# Patient Record
Sex: Male | Born: 1955 | Race: White | Hispanic: No | Marital: Single | Smoking: Former smoker
Health system: Southern US, Community
[De-identification: ages and names within clinical notes are randomized; demographics above are authoritative.]

## PROBLEM LIST (undated history)

## (undated) DIAGNOSIS — I10 Essential (primary) hypertension: Secondary | ICD-10-CM

## (undated) DIAGNOSIS — M1990 Unspecified osteoarthritis, unspecified site: Secondary | ICD-10-CM

## (undated) DIAGNOSIS — J449 Chronic obstructive pulmonary disease, unspecified: Secondary | ICD-10-CM

## (undated) HISTORY — PX: TONSILLECTOMY: SUR1361

## (undated) HISTORY — PX: KNEE SURGERY: SHX244

---

## 2003-07-02 ENCOUNTER — Emergency Department (HOSPITAL_COMMUNITY): Admission: EM | Admit: 2003-07-02 | Discharge: 2003-07-02 | Payer: Self-pay | Admitting: Emergency Medicine

## 2005-10-09 ENCOUNTER — Emergency Department (HOSPITAL_COMMUNITY): Admission: EM | Admit: 2005-10-09 | Discharge: 2005-10-10 | Payer: Self-pay | Admitting: Emergency Medicine

## 2010-03-12 ENCOUNTER — Encounter: Payer: Self-pay | Admitting: Internal Medicine

## 2010-03-12 DIAGNOSIS — Z8639 Personal history of other endocrine, nutritional and metabolic disease: Secondary | ICD-10-CM

## 2010-03-12 DIAGNOSIS — M545 Low back pain, unspecified: Secondary | ICD-10-CM | POA: Insufficient documentation

## 2010-03-12 DIAGNOSIS — K429 Umbilical hernia without obstruction or gangrene: Secondary | ICD-10-CM | POA: Insufficient documentation

## 2010-03-12 DIAGNOSIS — E7800 Pure hypercholesterolemia, unspecified: Secondary | ICD-10-CM | POA: Insufficient documentation

## 2010-03-12 DIAGNOSIS — R062 Wheezing: Secondary | ICD-10-CM | POA: Insufficient documentation

## 2010-03-12 DIAGNOSIS — I10 Essential (primary) hypertension: Secondary | ICD-10-CM | POA: Insufficient documentation

## 2010-03-12 DIAGNOSIS — Z862 Personal history of diseases of the blood and blood-forming organs and certain disorders involving the immune mechanism: Secondary | ICD-10-CM | POA: Insufficient documentation

## 2010-03-12 DIAGNOSIS — M25569 Pain in unspecified knee: Secondary | ICD-10-CM | POA: Insufficient documentation

## 2010-03-12 DIAGNOSIS — J309 Allergic rhinitis, unspecified: Secondary | ICD-10-CM | POA: Insufficient documentation

## 2010-03-12 DIAGNOSIS — G4730 Sleep apnea, unspecified: Secondary | ICD-10-CM | POA: Insufficient documentation

## 2010-03-12 DIAGNOSIS — I8390 Asymptomatic varicose veins of unspecified lower extremity: Secondary | ICD-10-CM | POA: Insufficient documentation

## 2010-03-24 ENCOUNTER — Telehealth: Payer: Self-pay | Admitting: Internal Medicine

## 2010-09-30 NOTE — Progress Notes (Signed)
Summary: rsc appt  Phone Note Call from Patient   Caller: [email or web address]  Call For: Jerrie Gullo Summary of Call: In ref to 7/13 cancelled appt, pt states he will call to rsc. Initial call taken by: Darletta Moll,  March 24, 2010 2:21 PM

## 2011-10-04 ENCOUNTER — Encounter (HOSPITAL_BASED_OUTPATIENT_CLINIC_OR_DEPARTMENT_OTHER): Payer: Self-pay | Admitting: *Deleted

## 2011-10-04 ENCOUNTER — Emergency Department (INDEPENDENT_AMBULATORY_CARE_PROVIDER_SITE_OTHER): Payer: Managed Care, Other (non HMO)

## 2011-10-04 ENCOUNTER — Emergency Department (HOSPITAL_BASED_OUTPATIENT_CLINIC_OR_DEPARTMENT_OTHER)
Admission: EM | Admit: 2011-10-04 | Discharge: 2011-10-04 | Disposition: A | Discharge status: Home / Self Care | Payer: Managed Care, Other (non HMO) | Attending: Emergency Medicine | Admitting: Emergency Medicine

## 2011-10-04 DIAGNOSIS — S8990XA Unspecified injury of unspecified lower leg, initial encounter: Secondary | ICD-10-CM

## 2011-10-04 DIAGNOSIS — M7730 Calcaneal spur, unspecified foot: Secondary | ICD-10-CM

## 2011-10-04 DIAGNOSIS — Z79899 Other long term (current) drug therapy: Secondary | ICD-10-CM | POA: Insufficient documentation

## 2011-10-04 DIAGNOSIS — X58XXXA Exposure to other specified factors, initial encounter: Secondary | ICD-10-CM

## 2011-10-04 DIAGNOSIS — M79609 Pain in unspecified limb: Secondary | ICD-10-CM

## 2011-10-04 DIAGNOSIS — Y92009 Unspecified place in unspecified non-institutional (private) residence as the place of occurrence of the external cause: Secondary | ICD-10-CM | POA: Insufficient documentation

## 2011-10-04 DIAGNOSIS — S99929A Unspecified injury of unspecified foot, initial encounter: Secondary | ICD-10-CM

## 2011-10-04 DIAGNOSIS — J4489 Other specified chronic obstructive pulmonary disease: Secondary | ICD-10-CM | POA: Insufficient documentation

## 2011-10-04 DIAGNOSIS — J449 Chronic obstructive pulmonary disease, unspecified: Secondary | ICD-10-CM | POA: Insufficient documentation

## 2011-10-04 DIAGNOSIS — Z8739 Personal history of other diseases of the musculoskeletal system and connective tissue: Secondary | ICD-10-CM | POA: Insufficient documentation

## 2011-10-04 DIAGNOSIS — X500XXA Overexertion from strenuous movement or load, initial encounter: Secondary | ICD-10-CM | POA: Insufficient documentation

## 2011-10-04 DIAGNOSIS — S93609A Unspecified sprain of unspecified foot, initial encounter: Secondary | ICD-10-CM | POA: Insufficient documentation

## 2011-10-04 HISTORY — DX: Chronic obstructive pulmonary disease, unspecified: J44.9

## 2011-10-04 HISTORY — DX: Unspecified osteoarthritis, unspecified site: M19.90

## 2011-10-04 MED ORDER — HYDROCODONE-ACETAMINOPHEN 5-325 MG PO TABS
2.0000 | ORAL_TABLET | Freq: Once | ORAL | Status: AC
  Administered 2011-10-04: 2 via ORAL
  Filled 2011-10-04: qty 2

## 2011-10-04 MED ORDER — HYDROCODONE-ACETAMINOPHEN 5-500 MG PO TABS: 1.0000 | ORAL_TABLET | Freq: Four times a day (QID) | ORAL | Status: AC | PRN

## 2011-10-04 NOTE — ED Provider Notes (Signed)
History     CSN: [id number]  Arrival date & time 10/04/11  2048   First MD Initiated Contact with Patient 10/04/11 2049      Chief Complaint  Patient presents with  . Foot Injury    (Consider location/radiation/quality/duration/timing/severity/associated sxs/prior treatment) HPI Comments: Pt states that he fell thru a folding chair and twisted his foot  Patient is a 56 y.o. male presenting with foot injury. The history is provided by the patient. No language interpreter was used.  Foot Injury  The incident occurred 3 to 5 hours ago. The incident occurred at home. The injury mechanism was a fall. The pain is present in the left foot. The quality of the pain is described as aching. The pain is moderate. The pain has been constant since onset. The symptoms are aggravated by activity and bearing weight.    Past Medical History  Diagnosis Date  . Arthritis   . Asthma   . COPD (chronic obstructive pulmonary disease)     Past Surgical History  Procedure Date  . Tonsillectomy   . Knee surgery     No family history on file.  History  Substance Use Topics  . Smoking status: Current Everyday Smoker -- 0.5 packs/day    Types: Cigarettes  . Smokeless tobacco: Never Used  . Alcohol Use: 6.0 oz/week    10 Cans of beer per week      Review of Systems  All other systems reviewed and are negative.    Allergies  Atorvastatin  Home Medications   Current Outpatient Rx  Name Route Sig Dispense Refill  . ALPRAZOLAM 0.5 MG PO TABS Oral Take 0.5 mg by mouth 2 (two) times daily as needed. For anxiety    . CITALOPRAM HYDROBROMIDE 20 MG PO TABS Oral Take 20 mg by mouth daily.    Marland Kitchen HYDROCHLOROTHIAZIDE 25 MG PO TABS Oral Take 25 mg by mouth daily.    . OMEGA-3-ACID ETHYL ESTERS 1 G PO CAPS Oral Take 1 g by mouth daily.    . TRAMADOL HCL 50 MG PO TABS Oral Take 50 mg by mouth every 6 (six) hours as needed. For pain    . VERAPAMIL HCL ER 180 MG PO CP24 Oral Take 180 mg by mouth at  bedtime.      BP 153/67  Pulse 81  Temp(Src) 98.9 F (37.2 C) (Oral)  Resp 20  Ht 6\' 1"  (1.854 m)  Wt 341 lb (154.677 kg)  BMI 44.99 kg/m2  SpO2 99%  Physical Exam  Vitals reviewed. Constitutional: He is oriented to person, place, and time. He appears well-developed and well-nourished.  HENT:  Head: Atraumatic.  Eyes: EOM are normal.  Cardiovascular: Normal rate and regular rhythm.   Pulmonary/Chest: Effort normal.  Musculoskeletal:       Pt tender and mild swelling noted to the left lateral foot:pulses intact  Neurological: He is oriented to person, place, and time.  Skin: Skin is warm and dry.  Psychiatric: He has a normal mood and affect.    ED Course  Procedures (including critical care time)  Labs Reviewed - No data to display Dg Foot Complete Left  10/04/2011  *RADIOLOGY REPORT*  Clinical Data: Pain and tenderness after blunt trauma  LEFT FOOT - COMPLETE 3+ VIEW  Comparison: None.  Findings: Calcaneal spur at the plantar aponeurosis. Negative for fracture, dislocation, or other acute abnormality.  Normal alignment and mineralization. No other significant degenerative change.  Regional soft tissues unremarkable.  IMPRESSION:  1.  No acute abnormality. 2.  Calcaneal spur.  Original Report Authenticated By: Thora Lance III, M.D.     1. Foot sprain       MDM  No acute bony abnormality:pt is okay to follow up with his orthopedist as needed        Teressa Lower, NP 10/04/11 2153

## 2011-10-04 NOTE — ED Provider Notes (Signed)
Medical screening examination/treatment/procedure(s) were performed by non-physician practitioner and as supervising physician I was immediately available for consultation/collaboration.  Jestine Bicknell, MD 10/04/11 2201 

## 2011-10-04 NOTE — ED Notes (Signed)
Pt reports he was standing on a folding chair while working on his truck and "the bottom fell out"- c/o left foot pain- unable to ambulate

## 2012-10-04 IMAGING — CR DG FOOT COMPLETE 3+V*L*
3 series · 3 of 3 positions shown · non-contrast
Comparison: None.

CLINICAL DATA: Pain and tenderness after blunt trauma

LEFT FOOT - COMPLETE 3+ VIEW

[t foot ap left]
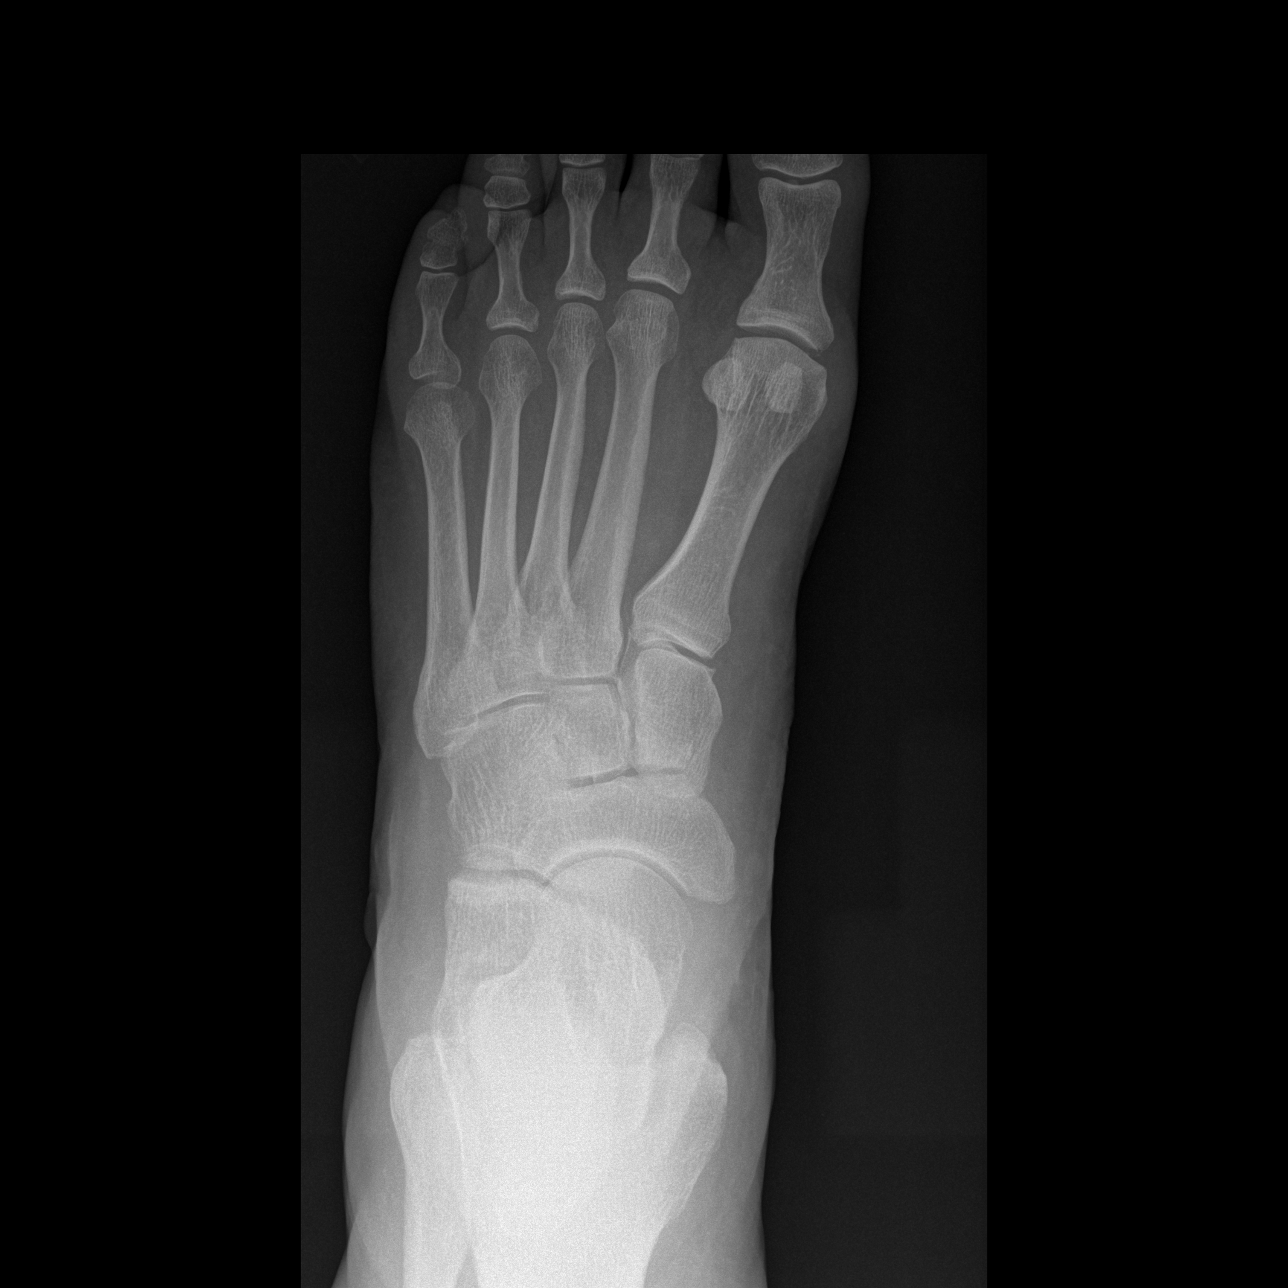

[t foot oblique left]
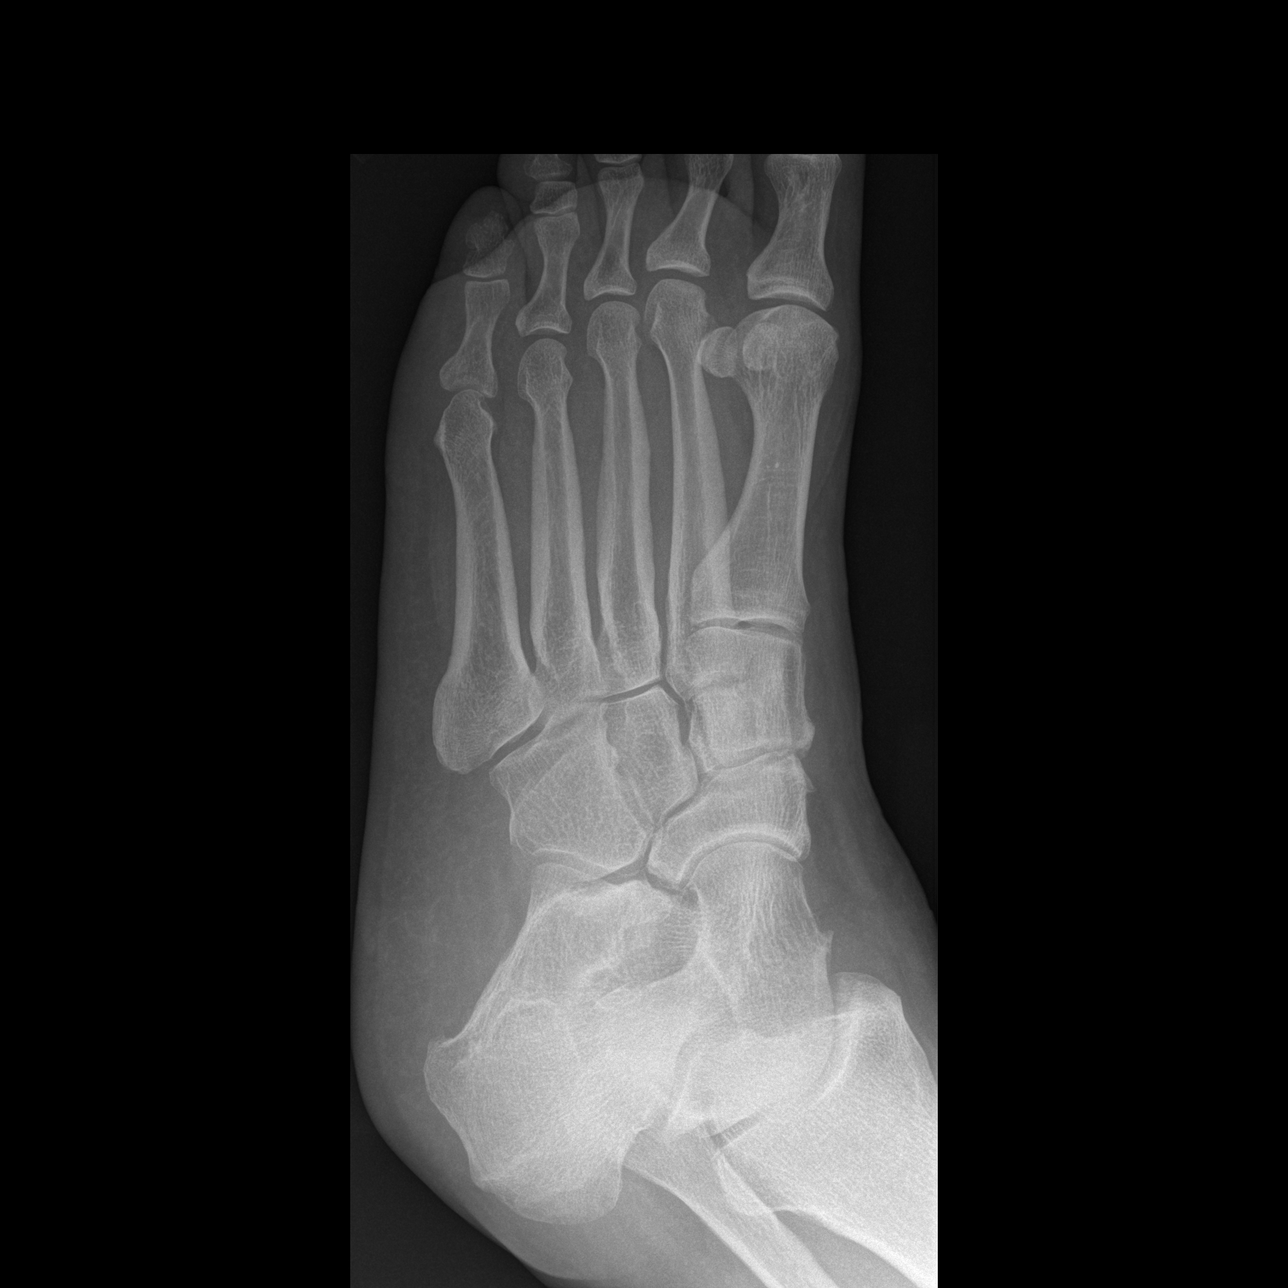

[t foot lat left]
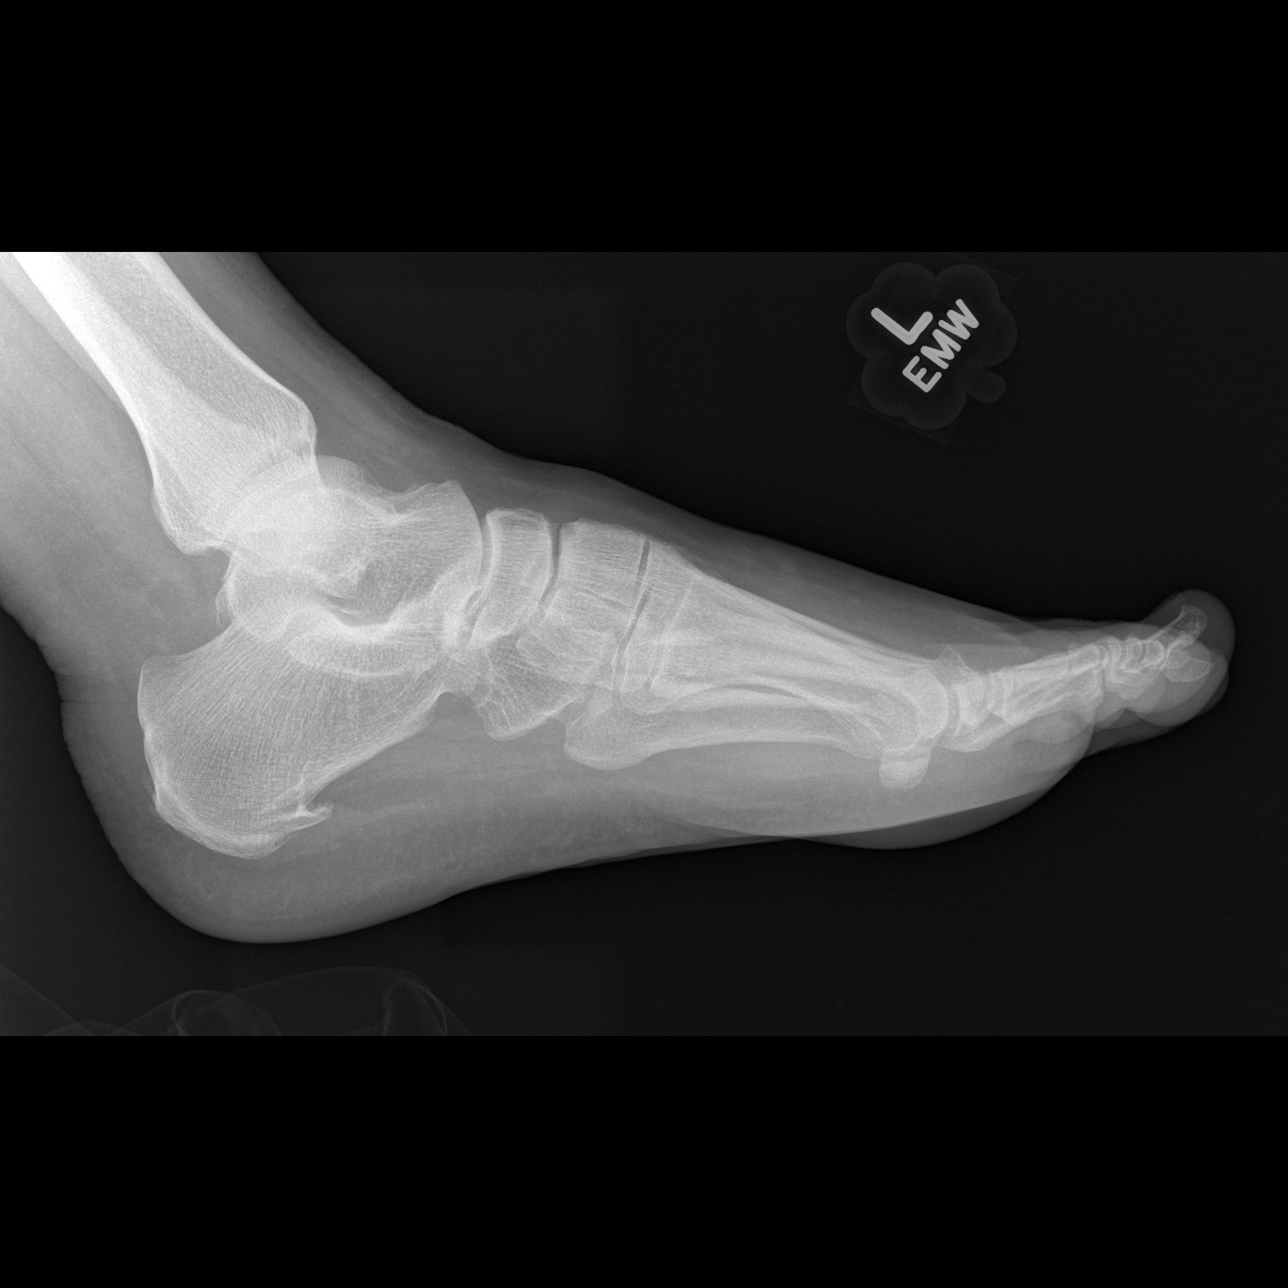

[3 of 3 positions shown; findings below may reference images not displayed]

FINDINGS: Calcaneal spur at the plantar aponeurosis. Negative for
fracture, dislocation, or other acute abnormality.  Normal
alignment and mineralization. No other significant degenerative
change.  Regional soft tissues unremarkable.
IMPRESSION: 1.  No acute abnormality.
2.  Calcaneal spur.

## 2016-11-25 ENCOUNTER — Emergency Department (HOSPITAL_COMMUNITY)
Admission: EM | Admit: 2016-11-25 | Discharge: 2016-11-25 | Disposition: A | Discharge status: Home / Self Care | Payer: BLUE CROSS/BLUE SHIELD | Attending: Emergency Medicine | Admitting: Emergency Medicine

## 2016-11-25 ENCOUNTER — Encounter (HOSPITAL_COMMUNITY): Payer: Self-pay | Admitting: Emergency Medicine

## 2016-11-25 DIAGNOSIS — Z79899 Other long term (current) drug therapy: Secondary | ICD-10-CM | POA: Insufficient documentation

## 2016-11-25 DIAGNOSIS — J449 Chronic obstructive pulmonary disease, unspecified: Secondary | ICD-10-CM | POA: Insufficient documentation

## 2016-11-25 DIAGNOSIS — I10 Essential (primary) hypertension: Secondary | ICD-10-CM | POA: Insufficient documentation

## 2016-11-25 DIAGNOSIS — F10230 Alcohol dependence with withdrawal, uncomplicated: Secondary | ICD-10-CM | POA: Diagnosis not present

## 2016-11-25 DIAGNOSIS — R251 Tremor, unspecified: Secondary | ICD-10-CM | POA: Diagnosis present

## 2016-11-25 DIAGNOSIS — Z87891 Personal history of nicotine dependence: Secondary | ICD-10-CM | POA: Insufficient documentation

## 2016-11-25 DIAGNOSIS — J45909 Unspecified asthma, uncomplicated: Secondary | ICD-10-CM | POA: Diagnosis not present

## 2016-11-25 HISTORY — DX: Essential (primary) hypertension: I10

## 2016-11-25 LAB — CBC WITH DIFFERENTIAL/PLATELET
Basophils Absolute: 0 10*3/uL (ref 0.0–0.1)
Basophils Relative: 1 %
Eosinophils Absolute: 0.2 10*3/uL (ref 0.0–0.7)
Eosinophils Relative: 3 %
HCT: 38.3 % — ABNORMAL LOW (ref 39.0–52.0)
Hemoglobin: 13.1 g/dL (ref 13.0–17.0)
Lymphocytes Relative: 19 %
Lymphs Abs: 1.2 10*3/uL (ref 0.7–4.0)
MCH: 33.1 pg (ref 26.0–34.0)
MCHC: 34.2 g/dL (ref 30.0–36.0)
MCV: 96.7 fL (ref 78.0–100.0)
Monocytes Absolute: 0.5 10*3/uL (ref 0.1–1.0)
Monocytes Relative: 9 %
Neutro Abs: 4.4 10*3/uL (ref 1.7–7.7)
Neutrophils Relative %: 68 %
Platelets: 147 10*3/uL — ABNORMAL LOW (ref 150–400)
RBC: 3.96 MIL/uL — ABNORMAL LOW (ref 4.22–5.81)
RDW: 13.9 % (ref 11.5–15.5)
WBC: 6.4 10*3/uL (ref 4.0–10.5)

## 2016-11-25 LAB — COMPREHENSIVE METABOLIC PANEL
ALT: 27 U/L (ref 17–63)
AST: 39 U/L (ref 15–41)
Albumin: 3.7 g/dL (ref 3.5–5.0)
Alkaline Phosphatase: 64 U/L (ref 38–126)
Anion gap: 8 (ref 5–15)
BUN: 10 mg/dL (ref 6–20)
CO2: 25 mmol/L (ref 22–32)
Calcium: 8.8 mg/dL — ABNORMAL LOW (ref 8.9–10.3)
Chloride: 106 mmol/L (ref 101–111)
Creatinine, Ser: 0.73 mg/dL (ref 0.61–1.24)
GFR calc Af Amer: >60 mL/min (ref >60)
GFR calc non Af Amer: >60 mL/min (ref >60)
Glucose, Bld: 96 mg/dL (ref 65–99)
Potassium: 3.8 mmol/L (ref 3.5–5.1)
Sodium: 139 mmol/L (ref 135–145)
Total Bilirubin: 0.7 mg/dL (ref 0.3–1.2)
Total Protein: 6.4 g/dL — ABNORMAL LOW (ref 6.5–8.1)

## 2016-11-25 LAB — ETHANOL: Alcohol, Ethyl (B): 15 mg/dL — ABNORMAL HIGH (ref <5)

## 2016-11-25 MED ORDER — CHLORDIAZEPOXIDE HCL 25 MG PO CAPS: ORAL_CAPSULE | ORAL | 0 refills | Status: AC

## 2016-11-25 MED ORDER — LORAZEPAM 1 MG PO TABS
1.0000 mg | ORAL_TABLET | Freq: Once | ORAL | Status: AC
  Administered 2016-11-25: 1 mg via ORAL
  Filled 2016-11-25: qty 1

## 2016-11-25 MED ORDER — VITAMIN B-1 100 MG PO TABS
100.0000 mg | ORAL_TABLET | Freq: Once | ORAL | Status: AC
  Administered 2016-11-25: 100 mg via ORAL
  Filled 2016-11-25: qty 1

## 2016-11-25 NOTE — ED Triage Notes (Signed)
Pt states "I'm an alcoholic". Pt states that he drinks about "a 5th a day". Per family member pt has been drinking since he was 61 years old.

## 2016-11-25 NOTE — Discharge Instructions (Signed)
Substance Abuse Treatment Programs  Intensive Outpatient Programs [facility]     [address]      [place], [place]                   [phone]       [facility] [address] [place], [place] [phone]  [facility]     (Inpatient and outpatient)     [address]           [phone]    [facility] [phone] (Suboxone and Methadone)  [address]      [place], [place] [address]      [phone]       [address] [place], [place] [phone]  [facility] (Outpatient/Inpatient, [facility])    (insurance only) [phone]             [facility] (Groups & Residential) [place], [place] [phone]     [facility]     [address]     [place], [place]      [phone]       [facility] (for caregivers and family) [address] [place], [place] [phone]      [facility]      [address], [place], [place] [address]  [phone]       [facility] [address] [place], [place] [address] [phone]  [facility]        [phone]       [facility] [phone]  [facility]             [address]                                         [place], [place]                                                [phone] or [phone]                               [facility] [place] [address] [place], [address] [phone]  [facility]    [address]      [place], [place]     [phone]       [facility] [address] [place], [place] [phone]  [facility]   [address]     [place], [place] [address]     [phone]      Admissions: 8am-3pm M-F  [facility] ([facility] [address] ,  [place] [phone]  [facility]: Residential Program (90 Days)   [place], [place]      [phone] or [phone]     [facility]: [facility] [place],  (Walk in Hours over the weekend or by referral)  [place] [facility] [address], [place],  [address] [phone]  Crisis Mobile: [facility]:  [phone] (for crisis response 24 hours a day) [facility] Hotline:      [phone] 

## 2016-11-25 NOTE — ED Provider Notes (Signed)
Emergency Department Provider Note   I have reviewed the triage vital signs and the nursing notes.   HISTORY  Chief Complaint Withdrawal   HPI Bret Woolsey is a 61 y.o. male with PMH of COPD, HTN, and EtOH abuse and stated the emergency department for evaluation of alcohol abuse and desire to detox. Patient has been drinking approximately 1/5 of liquor plus some beer daily for the past 30 years. He states he's not quit for any extended period of time. Never undergone detox. He notes that when he does slow down his drinking he begins to get "the shakes." He states that he met a friend that AA who has been encouraging him to get help in detox. He states he found someone to watch his dogs today and so came up to the emergency department. He denies any nausea or vomiting. Denies any weakness or numbness. His last drink was yesterday at approximately 9 PM. He denies any other drug use. No history of seizure or other severe withdrawal reactions but the patient denies ever stopping for significant period of time. No radiation of symptoms.   Past Medical History:  Diagnosis Date  . Arthritis   . Asthma   . COPD (chronic obstructive pulmonary disease) ([place])   . Hypertension     Patient Active Problem List   Diagnosis Date Noted  . HYPERCHOLESTEROLEMIA 03/12/2010  . ESSENTIAL HYPERTENSION, BENIGN 03/12/2010  . VARICOSE VEINS, LOWER EXTREMITIES 03/12/2010  . ALLERGIC RHINITIS 03/12/2010  . UMBILICAL HERNIA 40/98/1191  . KNEE PAIN, BILATERAL 03/12/2010  . BACK PAIN, LUMBAR 03/12/2010  . SLEEP APNEA 03/12/2010  . WHEEZING 03/12/2010  . LIVER FUNCTION TESTS, ABNORMAL, HX OF 03/12/2010    Past Surgical History:  Procedure Laterality Date  . KNEE SURGERY    . TONSILLECTOMY      Current Outpatient Rx  . Order #: [id number] Class: Historical Med  . Order #: [id number] Class: Historical Med  . Order #: [id number] Class: Historical Med  . Order #: [id number] Class: Historical Med  . Order #:  [id number] Class: Historical Med  . Order #: [id number] Class: Print  . Order #: [id number] Class: Historical Med    Allergies Atorvastatin  No family history on file.  Social History Social History  Substance Use Topics  . Smoking status: Former Smoker    Packs/day: 0.50    Types: Cigarettes  . Smokeless tobacco: Never Used  . Alcohol use 6.0 oz/week    10 Cans of beer per week     Comment: Pt states he drinks "about a 5th a day"    Review of Systems  Constitutional: No fever/chills Eyes: No visual changes. ENT: No sore throat. Cardiovascular: Denies chest pain. Respiratory: Denies shortness of breath. Gastrointestinal: No abdominal pain.  No nausea, no vomiting.  No diarrhea.  No constipation. Genitourinary: Negative for dysuria. Musculoskeletal: Negative for back pain. Skin: Negative for rash. Neurological: Negative for headaches, focal weakness or numbness. Positive tremor.  10-point ROS otherwise negative.  ____________________________________________   PHYSICAL EXAM:  VITAL SIGNS: ED Triage Vitals  Enc Vitals Group     BP 11/25/16 0700 (!) 159/99     Pulse Rate 11/25/16 0700 71     Resp 11/25/16 0700 17     Temp 11/25/16 0700 97.8 F (36.6 C)     Temp Source 11/25/16 0700 Oral     SpO2 11/25/16 0700 98 %     Weight 11/25/16 0700 191 lb (86.6 kg)     Height 11/25/16 0700 '6\' 1"'$  (1.854 m)  Pain Score 11/25/16 0648 0   Constitutional: Alert and oriented. Well appearing and in no acute distress. Slight tremor noted.  Eyes: Conjunctivae are normal. Head: Atraumatic. Nose: No congestion/rhinnorhea. Mouth/Throat: Mucous membranes are moist.  Oropharynx non-erythematous. Neck: No stridor. Cardiovascular: Normal rate, regular rhythm. Good peripheral circulation. Grossly normal heart sounds.   Respiratory: Normal respiratory effort.  No retractions. Lungs CTAB. Gastrointestinal: Soft and nontender. No distention.  Musculoskeletal: No lower extremity tenderness nor  edema. No gross deformities of extremities. Neurologic:  Normal speech and language. No gross focal neurologic deficits are appreciated. Positive mild tremor of the hands and jaw.  Skin:  Skin is warm, dry and intact. No rash noted. Psychiatric: Mood and affect are normal. Speech and behavior are normal.  ____________________________________________   LABS (all labs ordered are listed, but only abnormal results are displayed)  Labs Reviewed  COMPREHENSIVE METABOLIC PANEL - Abnormal; Notable for the following:       Result Value   Calcium 8.8 (*)    Total Protein 6.4 (*)    All other components within normal limits  ETHANOL - Abnormal; Notable for the following:    Alcohol, Ethyl (B) 15 (*)    All other components within normal limits  CBC WITH DIFFERENTIAL/PLATELET - Abnormal; Notable for the following:    RBC 3.96 (*)    HCT 38.3 (*)    Platelets 147 (*)    All other components within normal limits   ____________________________________________  EKG   EKG Interpretation  Date/Time:  Wednesday November 25 2016 06:53:30 EDT Ventricular Rate:  80 PR Interval:    QRS Duration: 136 QT Interval:  451 QTC Calculation: 521 R Axis:   63 Text Interpretation:  Sinus rhythm Left bundle branch block Baseline wander in lead(s) I III aVL V5 No significant change since Confirmed by POLLINA  MD, CHRISTOPHER ([id number]) on 11/25/2016 7:25:22 AM      ____________________________________________   PROCEDURES  Procedure(s) performed:   Procedures  None ____________________________________________   INITIAL IMPRESSION / ASSESSMENT AND PLAN / ED COURSE  Pertinent labs & imaging results that were available during my care of the patient were reviewed by me and considered in my medical decision making (see chart for details).  Patient resents to the emergency department for evaluation of tremor in the setting of alcohol withdrawal. He has a long history of alcohol abuse with no prior  attempts at detox. At this time the patient seems reasonably comfortable with only mild tremor. No other significant vital sign abnormalities. Discussed the process of screening for inpatient versus outpatient detox. The patient states he's most interested in outpatient detox. Plan for labs and findings supplementation. With mild tremor will give 1 mg of Ativan.   07:30 AM Initial CIWA is 4 (tremor only). Plan to monitor and reassess.   08:30 AM No new symptoms. Withdrawal symptoms mild. Provided Librium and will discharge with local outpatient detox resources.   At this time, I do not feel there is any life-threatening condition present. I have reviewed and discussed all results (EKG, imaging, lab, urine as appropriate), exam findings with patient. I have reviewed nursing notes and appropriate previous records.  I feel the patient is safe to be discharged home without further emergent workup. Discussed usual and customary return precautions. Patient and family (if present) verbalize understanding and are comfortable with this plan.  Patient will follow-up with their primary care provider. If they do not have a primary care provider, information for follow-up has been provided  to them. All questions have been answered.  ____________________________________________  FINAL CLINICAL IMPRESSION(S) / ED DIAGNOSES  Final diagnoses:  Alcohol dependence with uncomplicated withdrawal (HCC)     MEDICATIONS GIVEN DURING THIS VISIT:  Medications  thiamine (VITAMIN B-1) tablet 100 mg (100 mg Oral Given 11/25/16 0717)  LORazepam (ATIVAN) tablet 1 mg (1 mg Oral Given 11/25/16 0717)     NEW OUTPATIENT MEDICATIONS STARTED DURING THIS VISIT:  Discharge Medication List as of 11/25/2016  9:03 AM    START taking these medications   Details  chlordiazePOXIDE (LIBRIUM) 25 MG capsule '50mg'$  PO TID x 1D, then 25-'50mg'$  PO BID X 1D, then 25-'50mg'$  PO QD X 1D, Print         Note:  This document was prepared using  Dragon voice recognition software and may include unintentional dictation errors.  Nanda Quinton, MD Emergency Medicine    Margette Fast, MD 11/25/16 [phone]
